# Patient Record
Sex: Female | Born: 1995 | Race: White | Hispanic: No | Marital: Single | Smoking: Never smoker
Health system: Southern US, Community
[De-identification: ages and names within clinical notes are randomized; demographics above are authoritative.]

---

## 2016-11-13 ENCOUNTER — Other Ambulatory Visit: Payer: Self-pay | Admitting: Internal Medicine

## 2016-11-13 ENCOUNTER — Other Ambulatory Visit: Payer: Self-pay | Admitting: Gastroenterology

## 2016-11-13 DIAGNOSIS — R112 Nausea with vomiting, unspecified: Secondary | ICD-10-CM

## 2016-11-13 DIAGNOSIS — R634 Abnormal weight loss: Secondary | ICD-10-CM

## 2016-11-21 ENCOUNTER — Ambulatory Visit
Admission: RE | Admit: 2016-11-21 | Discharge: 2016-11-21 | Disposition: A | Discharge status: Home / Self Care | Payer: BLUE CROSS/BLUE SHIELD | Source: Ambulatory Visit | Attending: Gastroenterology | Admitting: Gastroenterology

## 2016-11-21 DIAGNOSIS — R112 Nausea with vomiting, unspecified: Secondary | ICD-10-CM

## 2016-11-21 DIAGNOSIS — R634 Abnormal weight loss: Secondary | ICD-10-CM

## 2018-02-26 ENCOUNTER — Ambulatory Visit (HOSPITAL_COMMUNITY)
Admission: EM | Admit: 2018-02-26 | Discharge: 2018-02-26 | Disposition: A | Discharge status: Home / Self Care | Payer: Managed Care, Other (non HMO) | Attending: Family Medicine | Admitting: Family Medicine

## 2018-02-26 ENCOUNTER — Encounter (HOSPITAL_COMMUNITY): Payer: Self-pay | Admitting: Emergency Medicine

## 2018-02-26 DIAGNOSIS — R6889 Other general symptoms and signs: Secondary | ICD-10-CM | POA: Diagnosis not present

## 2018-02-26 MED ORDER — OSELTAMIVIR PHOSPHATE 75 MG PO CAPS
75.0000 mg | ORAL_CAPSULE | Freq: Two times a day (BID) | ORAL | 0 refills | Status: AC
Start: 2018-02-26 — End: ?

## 2018-02-26 NOTE — ED Provider Notes (Signed)
[facility]   [id number] 02/26/18 Arrival Time: 0920   CC: flu symptoms   SUBJECTIVE: History from: patient.  Ameera Bouley is a 23 y.o. female who presents with abrupt onset of runny nose, sore throat, cough, nausea, body aches, subjective fever, chills, and myalgias x 2 days.  Admits to multiple sick exposures to the flu at work and home.  Has not tried OTC medications.  Reports previous symptoms in the past and diagnosed with the flu.   Denies sinus pain, SOB, wheezing, chest pain, changes in bowel or bladder habits.    Received flu shot this year: no.  ROS: As per HPI.  History reviewed. No pertinent past medical history. History reviewed. No pertinent surgical history. Allergies  Allergen Reactions  . Penicillins    No current facility-administered medications on file prior to encounter.    No current outpatient medications on file prior to encounter.   Social History   Socioeconomic History  . Marital status: Single    Spouse name: Not on file  . Number of children: Not on file  . Years of education: Not on file  . Highest education level: Not on file  Occupational History  . Not on file  Social Needs  . Financial resource strain: Not on file  . Food insecurity:    Worry: Not on file    Inability: Not on file  . Transportation needs:    Medical: Not on file    Non-medical: Not on file  Tobacco Use  . Smoking status: Never Smoker  . Smokeless tobacco: Never Used  Substance and Sexual Activity  . Alcohol use: Not Currently  . Drug use: Never  . Sexual activity: Not on file  Lifestyle  . Physical activity:    Days per week: Not on file    Minutes per session: Not on file  . Stress: Not on file  Relationships  . Social connections:    Talks on phone: Not on file    Gets together: Not on file    Attends religious service: Not on file    Active member of club or organization: Not on file    Attends meetings of clubs or organizations:  Not on file    Relationship status: Not on file  . Intimate partner violence:    Fear of current or ex partner: Not on file    Emotionally abused: Not on file    Physically abused: Not on file    Forced sexual activity: Not on file  Other Topics Concern  . Not on file  Social History Narrative  . Not on file   Family History  Problem Relation Age of Onset  . Healthy Mother     OBJECTIVE:  Vitals:   02/26/18 1018  BP: 119/61  Pulse: 64  Resp: 18  Temp: 98.6 F (37 C)  TempSrc: Oral  SpO2: 100%     General appearance: alert; appears fatigued, but nontoxic; speaking in full sentences and tolerating own secretions HEENT: NCAT; Ears: EACs clear, TMs pearly gray; Eyes: PERRL.  EOM grossly intact. Nose: nares patent with mild rhinorrhea, Throat: oropharynx clear, tonsils non erythematous or enlarged, uvula midline  Neck: supple without LAD Lungs: unlabored respirations, symmetrical air entry; cough: mild; no respiratory distress; CTAB Heart: regular rate and rhythm.  Radial pulses 2+ symmetrical bilaterally Skin: warm and dry Psychological: alert and cooperative; normal mood and affect  ASSESSMENT & PLAN:  1. Flu-like symptoms     Meds ordered this encounter  Medications  . oseltamivir (TAMIFLU) 75 MG capsule    Sig: Take 1 capsule (75 mg total) by mouth every 12 (twelve) hours.    Dispense:  10 capsule    Refill:  0    Order Specific Question:   Supervising Provider    Answer:   Eustace Moore [[id number]]    Get plenty of rest and push fluids.  Drink at least half your body weight in ounces.  You may supplement with OTC Pedialyte or oral rehydration solution Tamiflu prescribed.  Take as directed and to completion Use OTC tylenol or ibuprofen every 4 hours for fever, body aches, pain, and chills Follow up with PCP if symptoms persist Go to the ED if you have any new or worsening symptoms fever that does not moderate with tylenol, chills, nausea, vomiting, chest  pain, worsening cough, shortness of breath, wheezing, abdominal pain, changes in bowel or bladder habits, etc...   Reviewed expectations re: course of current medical issues. Questions answered. Outlined signs and symptoms indicating need for more acute intervention. Patient verbalized understanding. After Visit Summary given.         Rennis Harding, PA-C 02/26/18 1056

## 2018-02-26 NOTE — Discharge Instructions (Signed)
Get plenty of rest and push fluids.  Drink at least half your body weight in ounces.  You may supplement with OTC Pedialyte or oral rehydration solution Tamiflu prescribed.  Take as directed and to completion Use OTC tylenol or ibuprofen every 4 hours for fever, body aches, pain, and chills Follow up with PCP if symptoms persist Go to the ED if you have any new or worsening symptoms fever that does not moderate with tylenol, chills, nausea, vomiting, chest pain, worsening cough, shortness of breath, wheezing, abdominal pain, changes in bowel or bladder habits, etc..Marland Kitchen

## 2018-02-26 NOTE — ED Triage Notes (Signed)
Pt here for URI sx with body aches x 2 days

## 2018-03-04 ENCOUNTER — Encounter (HOSPITAL_COMMUNITY): Payer: Self-pay | Admitting: Emergency Medicine

## 2018-03-04 ENCOUNTER — Other Ambulatory Visit: Payer: Self-pay

## 2018-03-04 ENCOUNTER — Ambulatory Visit (HOSPITAL_COMMUNITY)
Admission: EM | Admit: 2018-03-04 | Discharge: 2018-03-04 | Disposition: A | Discharge status: Home / Self Care | Payer: BLUE CROSS/BLUE SHIELD | Attending: Internal Medicine | Admitting: Internal Medicine

## 2018-03-04 DIAGNOSIS — G933 Postviral fatigue syndrome: Secondary | ICD-10-CM

## 2018-03-04 DIAGNOSIS — G9331 Postviral fatigue syndrome: Secondary | ICD-10-CM

## 2018-03-04 MED ORDER — ALBUTEROL SULFATE HFA 108 (90 BASE) MCG/ACT IN AERS: 1.0000 | INHALATION_SPRAY | Freq: Four times a day (QID) | RESPIRATORY_TRACT | 0 refills | Status: AC | PRN

## 2018-03-04 MED ORDER — DM-GUAIFENESIN ER 30-600 MG PO TB12: 1.0000 | ORAL_TABLET | Freq: Two times a day (BID) | ORAL | 0 refills | Status: AC

## 2018-03-04 NOTE — ED Triage Notes (Signed)
PT was seen here 2/14 and diagnosed with the flu. PT has taken tamilflu and feels worse. Congested cough, chest pain, fatigue.

## 2018-03-04 NOTE — ED Provider Notes (Signed)
[facility]    CSN: [id number] Arrival date & time: 03/04/18  0932     History   Chief Complaint Chief Complaint  Patient presents with  . Influenza  . Cough    HPI Christine Burton is a 23 y.o. female was recently seen here for influenza.  She was prescribed Tamiflu and discharged home.  Patient returns to the urgent care today with complaints of cough and worsening fatigue.  Patient says cough is slightly productive.  Sputum is with a greenish tinge.  She has not had any relief from the cough using any over-the-counter remedies.  Patient has chest pain with the cough.  No vomiting or diarrhea.  HPI  History reviewed. No pertinent past medical history.  There are no active problems to display for this patient.   History reviewed. No pertinent surgical history.  OB History   No obstetric history on file.      Home Medications    Prior to Admission medications   Medication Sig Start Date End Date Taking? Authorizing Provider  oseltamivir (TAMIFLU) 75 MG capsule Take 1 capsule (75 mg total) by mouth every 12 (twelve) hours. 02/26/18  Yes Wurst, [place], PA-C  albuterol (PROVENTIL HFA;VENTOLIN HFA) 108 (90 Base) MCG/ACT inhaler Inhale 1-2 puffs into the lungs every 6 (six) hours as needed for wheezing or shortness of breath. 03/04/18   Merrilee Jansky, MD  dextromethorphan-guaiFENesin (MUCINEX DM) 30-600 MG 12hr tablet Take 1 tablet by mouth 2 (two) times daily. 03/04/18   Merrilee Jansky, MD    Family History Family History  Problem Relation Age of Onset  . Healthy Mother     Social History Social History   Tobacco Use  . Smoking status: Never Smoker  . Smokeless tobacco: Never Used  Substance Use Topics  . Alcohol use: Not Currently  . Drug use: Never     Allergies   Penicillins   Review of Systems Review of Systems  Constitutional: Positive for activity change and fatigue. Negative for fever.  HENT: Negative for ear discharge, ear  pain, mouth sores and rhinorrhea.   Respiratory: Positive for cough. Negative for chest tightness and wheezing.   Gastrointestinal: Negative for abdominal distention and abdominal pain.  Musculoskeletal: Positive for arthralgias and myalgias. Negative for joint swelling.  Neurological: Positive for weakness. Negative for dizziness and light-headedness.     Physical Exam Triage Vital Signs ED Triage Vitals [03/04/18 1028]  Enc Vitals Group     BP 120/68     Pulse Rate (!) 101     Resp 16     Temp 98.3 F (36.8 C)     Temp Source Oral     SpO2 100 %     Weight      Height      Head Circumference      Peak Flow      Pain Score 3     Pain Loc      Pain Edu?      Excl. in GC?    No data found.  Updated Vital Signs BP 120/68   Pulse (!) 101   Temp 98.3 F (36.8 C) (Oral)   Resp 16   LMP 02/15/2018   SpO2 100%   Visual Acuity Right Eye Distance:   Left Eye Distance:   Bilateral Distance:    Right Eye Near:   Left Eye Near:    Bilateral Near:     Physical Exam Constitutional:      Appearance:  Normal appearance. She is not ill-appearing.  HENT:     Right Ear: Tympanic membrane normal.     Left Ear: Tympanic membrane normal.     Nose: No congestion or rhinorrhea.     Mouth/Throat:     Pharynx: No posterior oropharyngeal erythema.  Eyes:     Conjunctiva/sclera: Conjunctivae normal.  Neck:     Musculoskeletal: Normal range of motion. No neck rigidity.  Cardiovascular:     Rate and Rhythm: Normal rate and regular rhythm.     Pulses: Normal pulses.     Heart sounds: Normal heart sounds.  Pulmonary:     Effort: Pulmonary effort is normal.     Breath sounds: Wheezing present. No rhonchi or rales.  Abdominal:     General: Bowel sounds are normal. There is no distension.     Palpations: Abdomen is soft. There is no mass.     Tenderness: There is no abdominal tenderness.  Musculoskeletal: Normal range of motion.        General: No swelling or deformity.    Lymphadenopathy:     Cervical: Cervical adenopathy present.  Skin:    General: Skin is warm.     Capillary Refill: Capillary refill takes less than 2 seconds.     Findings: No rash.  Neurological:     General: No focal deficit present.     Mental Status: She is alert.      UC Treatments / Results  Labs (all labs ordered are listed, but only abnormal results are displayed) Labs Reviewed - No data to display  EKG None  Radiology No results found.  Procedures Procedures (including critical care time)  Medications Ordered in UC Medications - No data to display  Initial Impression / Assessment and Plan / UC Course  I have reviewed the triage vital signs and the nursing notes.  Pertinent labs & imaging results that were available during my care of the patient were reviewed by me and considered in my medical decision making (see chart for details).     1.  Postviral fatigue: Dextromethorphan Albuterol inhaler Postviral fatigue typically resolves over time. Supportive care  Final Clinical Impressions(s) / UC Diagnoses   Final diagnoses:  Postviral fatigue syndrome   Discharge Instructions   None    ED Prescriptions    Medication Sig Dispense Auth. Provider   dextromethorphan-guaiFENesin (MUCINEX DM) 30-600 MG 12hr tablet Take 1 tablet by mouth 2 (two) times daily. 14 tablet Attila Mccarthy, Britta Mccreedy, MD   albuterol (PROVENTIL HFA;VENTOLIN HFA) 108 (90 Base) MCG/ACT inhaler Inhale 1-2 puffs into the lungs every 6 (six) hours as needed for wheezing or shortness of breath. 1 Inhaler Verenis Nicos[place], Britta Mccreedy, MD     Controlled Substance Prescriptions [place] Controlled Substance Registry consulted? Not Applicable   Merrilee Jansky, MD 03/04/18 [phone]

## 2019-10-24 IMAGING — US US ABDOMEN COMPLETE
1 series · 14 of 25 positions shown · non-contrast
Comparison: None.

CLINICAL DATA: Nausea and vomiting with weight loss 6 months.

EXAM:
ABDOMEN ULTRASOUND COMPLETE

[Series 1: us abdomen complete · 0.23mm/px · 14 of 99 slices shown]
[im 1/99]
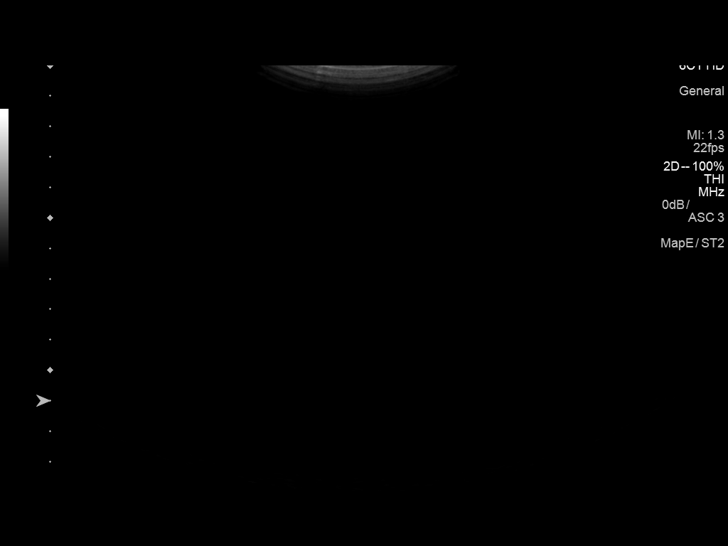
[im 9/99]
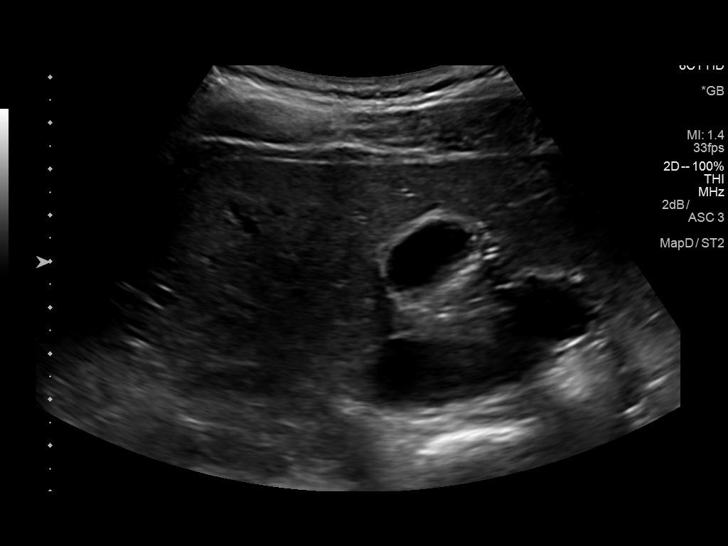
[im 17/99]
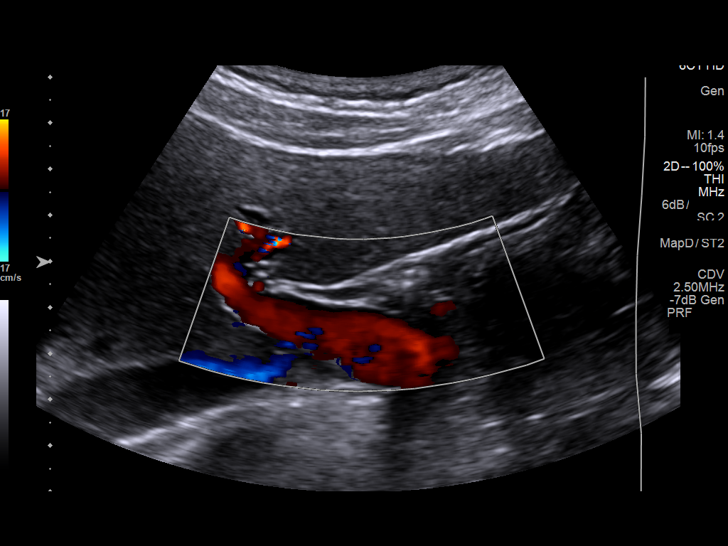
[im 25/99]
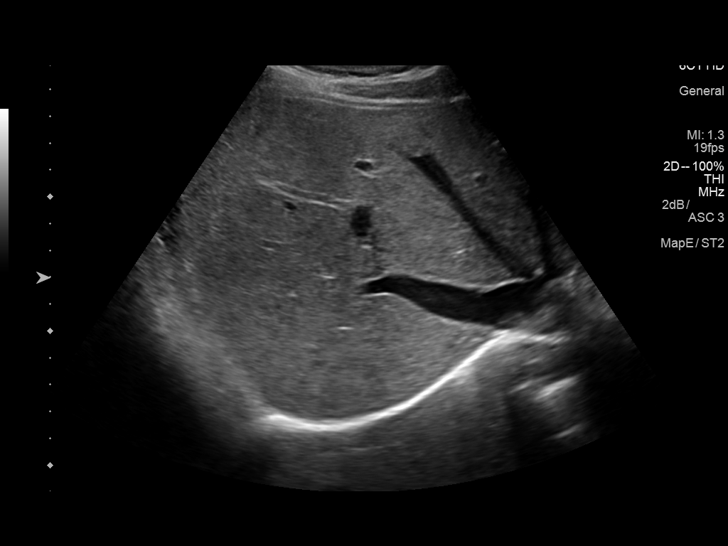
[im 33/99]
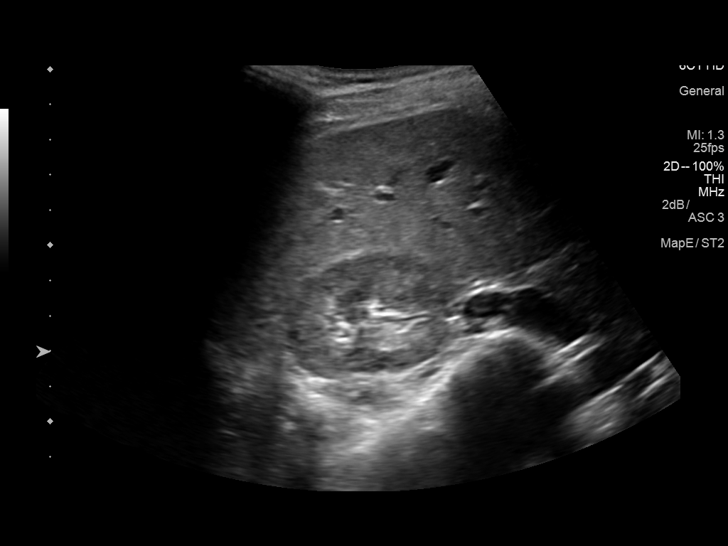
[im 37/99]
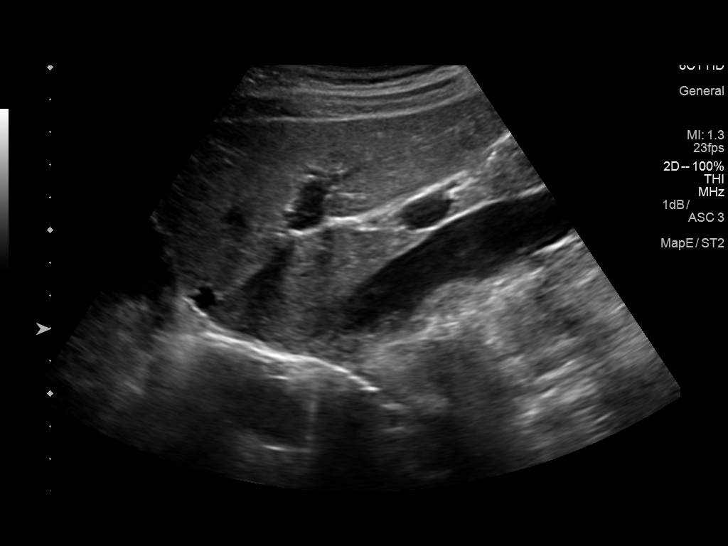
[im 45/99]
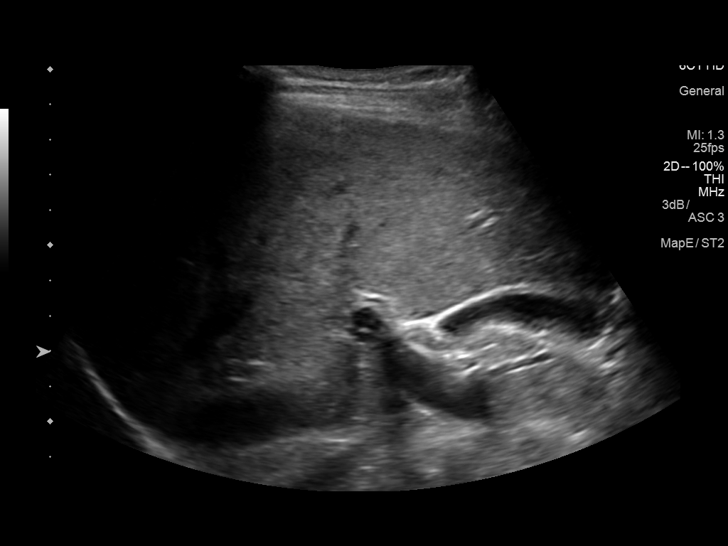
[im 54/99]
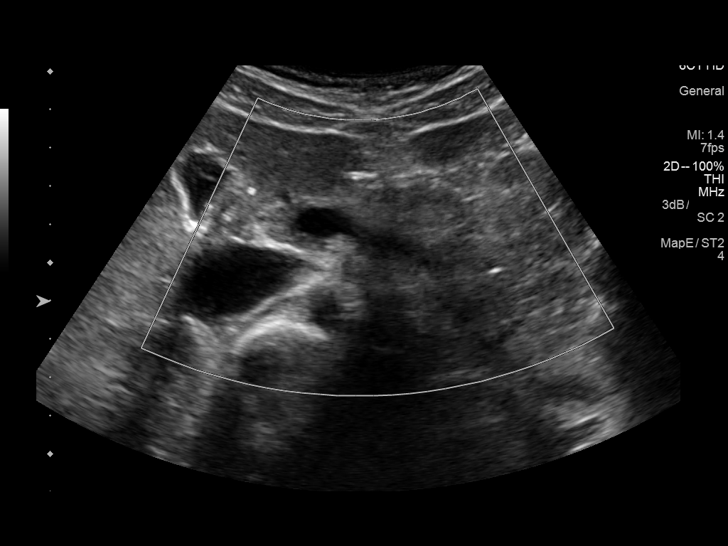
[im 62/99]
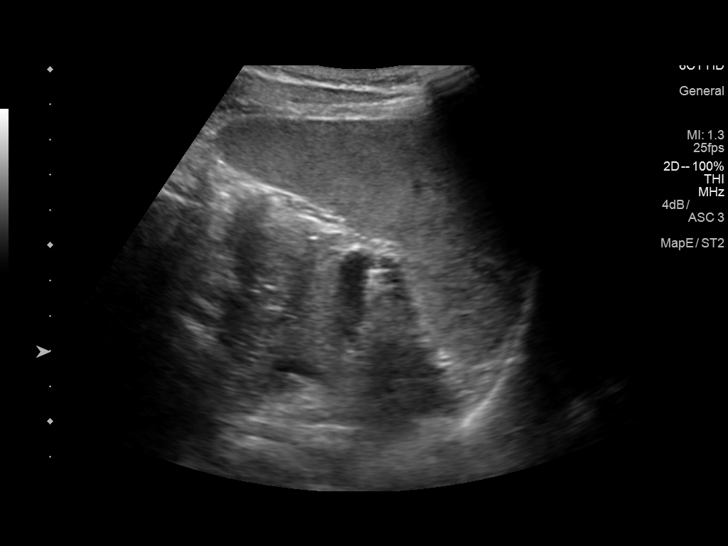
[im 66/99]
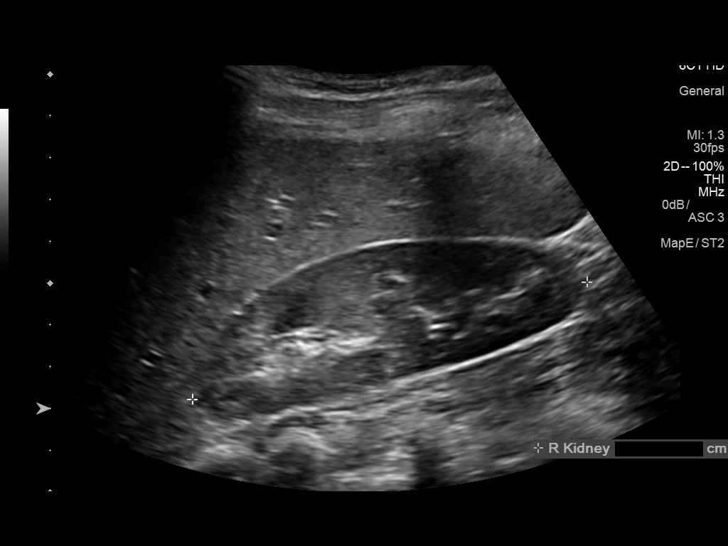
[im 74/99]
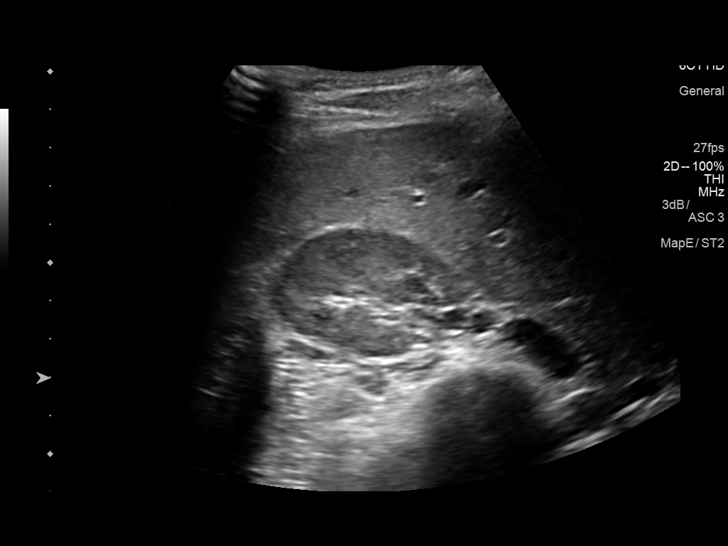
[im 82/99]
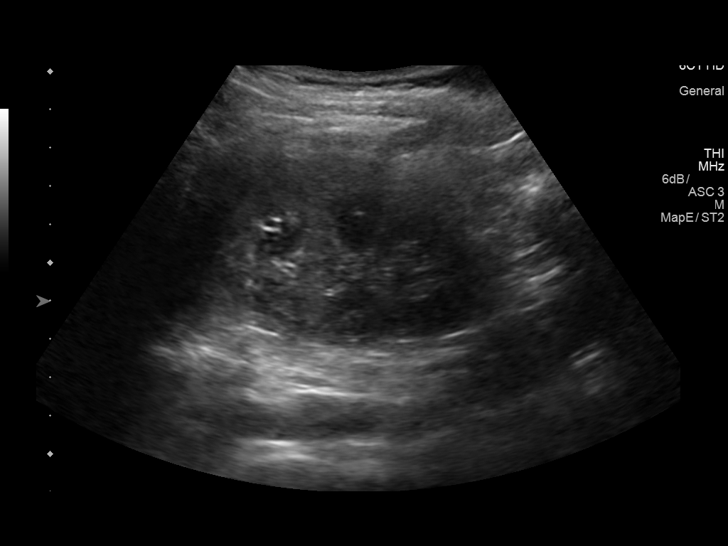
[im 90/99]
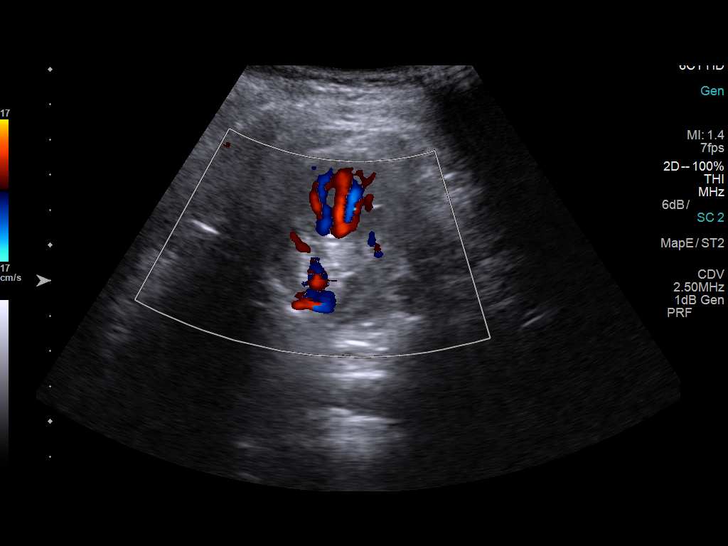
[im 99/99]
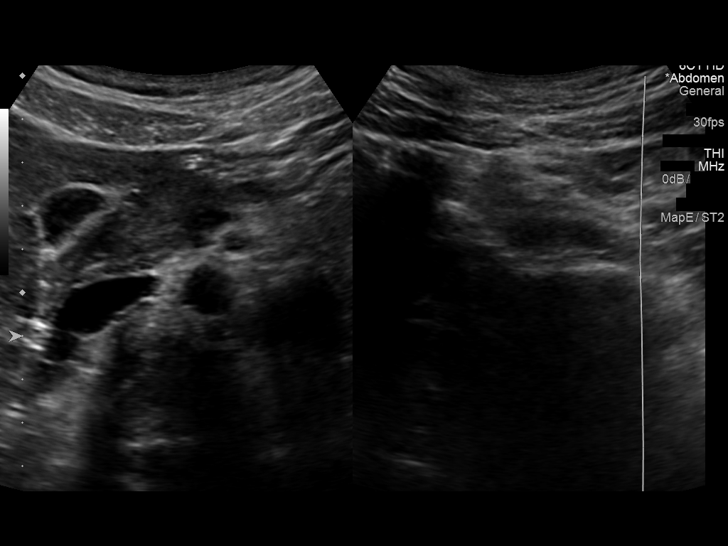

[14 of 25 positions shown; findings below may reference images not displayed]

FINDINGS: Gallbladder: No gallstones or wall thickening visualized. No
sonographic Murphy sign noted by sonographer.

Common bile duct: Diameter: 2.6 mm.

Liver: No focal lesion identified. Within normal limits in
parenchymal echogenicity. Portal vein is patent on color Doppler
imaging with normal direction of blood flow towards the liver.

IVC: No abnormality visualized.

Pancreas: Visualized portion unremarkable.

Spleen: Size and appearance within normal limits.

Right Kidney: Length: 9.9 cm. Echogenicity within normal limits. No
mass or hydronephrosis visualized.

Left Kidney: Length: 9.9 cm. Echogenicity within normal limits. No
mass or hydronephrosis visualized.

Abdominal aorta: No aneurysm visualized.

Other findings: None.
IMPRESSION: Normal abdominal ultrasound.
